# Patient Record
Sex: Male | Born: 1995 | Race: Black or African American | Hispanic: No | Marital: Single | Smoking: Never smoker
Health system: Southern US, Community
[De-identification: ages and names within clinical notes are randomized; demographics above are authoritative.]

---
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
## 2014-06-24 ENCOUNTER — Emergency Department (HOSPITAL_COMMUNITY)
Admission: EM | Admit: 2014-06-24 | Discharge: 2014-06-25 | Disposition: A | Discharge status: Home / Self Care | Payer: Medicaid Other | Attending: Emergency Medicine | Admitting: Emergency Medicine

## 2014-06-24 DIAGNOSIS — S60811A Abrasion of right wrist, initial encounter: Secondary | ICD-10-CM | POA: Insufficient documentation

## 2014-06-24 DIAGNOSIS — T07XXXA Unspecified multiple injuries, initial encounter: Secondary | ICD-10-CM

## 2014-06-24 DIAGNOSIS — Y9389 Activity, other specified: Secondary | ICD-10-CM | POA: Insufficient documentation

## 2014-06-24 DIAGNOSIS — S80212A Abrasion, left knee, initial encounter: Secondary | ICD-10-CM | POA: Insufficient documentation

## 2014-06-24 DIAGNOSIS — M25561 Pain in right knee: Secondary | ICD-10-CM

## 2014-06-24 DIAGNOSIS — Y998 Other external cause status: Secondary | ICD-10-CM | POA: Insufficient documentation

## 2014-06-24 DIAGNOSIS — M25531 Pain in right wrist: Secondary | ICD-10-CM

## 2014-06-24 DIAGNOSIS — S80211A Abrasion, right knee, initial encounter: Secondary | ICD-10-CM | POA: Insufficient documentation

## 2014-06-24 DIAGNOSIS — Y92410 Unspecified street and highway as the place of occurrence of the external cause: Secondary | ICD-10-CM | POA: Insufficient documentation

## 2014-06-25 ENCOUNTER — Encounter (HOSPITAL_COMMUNITY): Payer: Self-pay | Admitting: Emergency Medicine

## 2014-06-25 ENCOUNTER — Emergency Department (HOSPITAL_COMMUNITY): Payer: Medicaid Other

## 2014-06-25 MED ORDER — IBUPROFEN 800 MG PO TABS: 800.0000 mg | ORAL_TABLET | Freq: Once | ORAL | Status: DC
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
## 2014-06-25 NOTE — Discharge Instructions (Signed)
You may take Tylenol or ibuprofen for pain.  Keep abrasions clean, wash with soap and water and apply topical antibiotic as needed.  Expect to be sore for the next 7-10 days.  Each day should be a little better.     Abrasion An abrasion is a cut or scrape of the skin. Abrasions do not extend through all layers of the skin and most heal within 10 days. It is important to care for your abrasion properly to prevent infection. CAUSES  Most abrasions are caused by falling on, or gliding across, the ground or other surface. When your skin rubs on something, the outer and inner layer of skin rubs off, causing an abrasion. DIAGNOSIS  Your caregiver will be able to diagnose an abrasion during a physical exam.  TREATMENT  Your treatment depends on how large and deep the abrasion is. Generally, your abrasion will be cleaned with water and a mild soap to remove any dirt or debris. An antibiotic ointment may be put over the abrasion to prevent an infection. A bandage (dressing) may be wrapped around the abrasion to keep it from getting dirty.  You may need a tetanus shot if:  You cannot remember when you had your last tetanus shot.  You have never had a tetanus shot.  The injury broke your skin. If you get a tetanus shot, your arm may swell, get red, and feel warm to the touch. This is common and not a problem. If you need a tetanus shot and you choose not to have one, there is a rare chance of getting tetanus. Sickness from tetanus can be serious.  HOME CARE INSTRUCTIONS   If a dressing was applied, change it at least once a day or as directed by your caregiver. If the bandage sticks, soak it off with warm water.   Wash the area with water and a mild soap to remove all the ointment 2 times a day. Rinse off the soap and pat the area dry with a clean towel.   Reapply any ointment as directed by your caregiver. This will help prevent infection and keep the bandage from sticking. Use gauze over the  wound and under the dressing to help keep the bandage from sticking.   Change your dressing right away if it becomes wet or dirty.   Only take over-the-counter or prescription medicines for pain, discomfort, or fever as directed by your caregiver.   Follow up with your caregiver within 24-48 hours for a wound check, or as directed. If you were not given a wound-check appointment, look closely at your abrasion for redness, swelling, or pus. These are signs of infection. SEEK IMMEDIATE MEDICAL CARE IF:   You have increasing pain in the wound.   You have redness, swelling, or tenderness around the wound.   You have pus coming from the wound.   You have a fever or persistent symptoms for more than 2-3 days.  You have a fever and your symptoms suddenly get worse.  You have a bad smell coming from the wound or dressing.  MAKE SURE YOU:   Understand these instructions.  Will watch your condition.  Will get help right away if you are not doing well or get worse. Document Released: 11/15/2004 Document Revised: 01/23/2012 Document Reviewed: 01/09/2011 [facility] Patient Information 2015 [employer], [place]. This information is not intended to replace advice given to you by your health care provider. Make sure you discuss any questions you have with your health care provider.  Cryotherapy Cryotherapy is when you put ice on your injury. Ice helps lessen pain and puffiness (swelling) after an injury. Ice works the best when you start using it in the first 24 to 48 hours after an injury. HOME CARE  Put a dry or damp towel between the ice pack and your skin.  You may press gently on the ice pack.  Leave the ice on for no more than 10 to 20 minutes at a time.  Check your skin after 5 minutes to make sure your skin is okay.  Rest at least 20 minutes between ice pack uses.  Stop using ice when your skin loses feeling (numbness).  Do not use ice on someone who cannot tell you when it  hurts. This includes small children and people with memory problems (dementia). GET HELP RIGHT AWAY IF:  You have white spots on your skin.  Your skin turns blue or pale.  Your skin feels waxy or hard.  Your puffiness gets worse. MAKE SURE YOU:   Understand these instructions.  Will watch your condition.  Will get help right away if you are not doing well or get worse. Document Released: 07/25/2007 Document Revised: 04/30/2011 Document Reviewed: 09/28/2010 [facility] Patient Information 2015 [place], [place]. This information is not intended to replace advice given to you by your health care provider. Make sure you discuss any questions you have with your health care provider.  Motor Vehicle Collision It is common to have multiple bruises and sore muscles after a motor vehicle collision (MVC). These tend to feel worse for the first 24 hours. You may have the most stiffness and soreness over the first several hours. You may also feel worse when you wake up the first morning after your collision. After this point, you will usually begin to improve with each day. The speed of improvement often depends on the severity of the collision, the number of injuries, and the location and nature of these injuries. HOME CARE INSTRUCTIONS  Put ice on the injured area.  Put ice in a plastic bag.  Place a towel between your skin and the bag.  Leave the ice on for 15-20 minutes, 3-4 times a day, or as directed by your health care provider.  Drink enough fluids to keep your urine clear or pale yellow. Do not drink alcohol.  Take a warm shower or bath once or twice a day. This will increase blood flow to sore muscles.  You may return to activities as directed by your caregiver. Be careful when lifting, as this may aggravate neck or back pain.  Only take over-the-counter or prescription medicines for pain, discomfort, or fever as directed by your caregiver. Do not use aspirin. This may increase  bruising and bleeding. SEEK IMMEDIATE MEDICAL CARE IF:  You have numbness, tingling, or weakness in the arms or legs.  You develop severe headaches not relieved with medicine.  You have severe neck pain, especially tenderness in the middle of the back of your neck.  You have changes in bowel or bladder control.  There is increasing pain in any area of the body.  You have shortness of breath, light-headedness, dizziness, or fainting.  You have chest pain.  You feel sick to your stomach (nauseous), throw up (vomit), or sweat.  You have increasing abdominal discomfort.  There is blood in your urine, stool, or vomit.  You have pain in your shoulder (shoulder strap areas).  You feel your symptoms are getting worse. MAKE SURE YOU:  Understand these instructions. °· Will watch your condition. °· Will get help right away if you are not doing well or get worse. °Document Released: 02/05/2005 Document Revised: 06/22/2013 Document Reviewed: 07/05/2010 °ExitCare® Patient Information ©2015 [employer], LLC. This information is not intended to replace advice given to you by your health care provider. Make sure you discuss any questions you have with your health care provider. ° °
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
## 2015-06-15 ENCOUNTER — Ambulatory Visit: Payer: Medicaid Other | Admitting: Internal Medicine

## 2015-06-15 DIAGNOSIS — Z0289 Encounter for other administrative examinations: Secondary | ICD-10-CM
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
## 2019-11-27 ENCOUNTER — Other Ambulatory Visit: Payer: Self-pay

## 2019-11-27 ENCOUNTER — Emergency Department (HOSPITAL_COMMUNITY)
Admission: EM | Admit: 2019-11-27 | Discharge: 2019-11-27 | Disposition: A | Discharge status: Home / Self Care | Payer: Self-pay | Attending: Emergency Medicine | Admitting: Emergency Medicine

## 2019-11-27 ENCOUNTER — Encounter (HOSPITAL_COMMUNITY): Payer: Self-pay

## 2019-11-27 DIAGNOSIS — R202 Paresthesia of skin: Secondary | ICD-10-CM | POA: Insufficient documentation

## 2019-11-27 DIAGNOSIS — R200 Anesthesia of skin: Secondary | ICD-10-CM

## 2019-11-27 MED ORDER — PREDNISONE 5 MG PO TABS: 5.0000 mg | ORAL_TABLET | Freq: Four times a day (QID) | ORAL | 0 refills | Status: DC

## 2019-11-27 NOTE — ED Provider Notes (Signed)
[facility]-EMERGENCY DEPT Provider Note   CSN: [id number] Arrival date & time: 11/27/19  1859     History Chief Complaint  Patient presents with  . Arm Numbness    Alan Moreno is a 24 y.o. male who presents for 1 day of left arm numbness and tingling, and "strange sensation" in the medial left bicep since last night.  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  He denies any trauma yesterday, injury, any new activities. He denies feeling of weakness in the arm. He has never injured his left shoulder before. No recent surgeries, prolonged immobilization, travel, or replacement therapy.  I personally reviewed this patient's medical record, he has no known medical diagnoses.  He is not any medications every day.  He reports remote history of asthma.  HPI    History reviewed. No pertinent past medical history.  There are no problems to display for this patient.   History reviewed. No pertinent surgical history.     Family History  Problem Relation Age of Onset  . Hypertension Other   . Diabetes Other     Social History   Tobacco Use  . Smoking status: Never Smoker  Substance Use Topics  . Alcohol use: No  . Drug use: No    Home Medications Prior to Admission medications   Medication Sig Start Date End Date Taking? Authorizing Provider  cetirizine (ZYRTEC) 10 MG tablet Take 10 mg by mouth daily.   Yes [provider]  fluticasone (FLONASE) 50 MCG/ACT nasal spray Place 2 sprays into both nostrils daily.   Yes [provider]  predniSONE (DELTASONE) 5 MG tablet Take 1 tablet (5 mg total) by mouth in the morning, at noon, in the evening, and at bedtime. Day 1: Take 2 tablets at breakfast, 1 tablet with lunch,1 tablet at dinner, 2 tablets at bedtime Day 2:  Take 1 tablet at breakfast, 1 tablet with lunch,1 tablet at dinner, 2 tablets at bedtime Day 3:  Take 1  tablet at breakfast, 1 tablet with lunch,1 tablet at dinner, 1 tablet at bedtime Day 4:  Take 1 tablet at breakfast, 1 tablet with lunch,1 tablet at bedtime Day 5:  Take 1 tablet at breakfast, 1 tablet at bedtime Day 6: Take 1 tablet at breakfast 11/27/19   Verlie Liotta R, PA-C    Allergies    Patient has no known allergies.  Review of Systems   Review of Systems  Constitutional: Negative.   Eyes: Negative.   Respiratory: Negative.   Cardiovascular: Negative.   Musculoskeletal: Positive for myalgias. Negative for neck pain.  Skin: Negative.   Neurological: Positive for numbness. Negative for dizziness, syncope, weakness, light-headedness and headaches.    Physical Exam Updated Vital Signs BP 134/75 (BP Location: Left Arm)   Pulse 78   Temp 98.1 F (36.7 C) (Oral)   Resp 18   Ht 6\' 2"  (1.88 m)   Wt 95.3 kg   SpO2 98%   BMI 26.96 kg/m   Physical Exam Vitals and nursing note reviewed.  HENT:     Head: Normocephalic and atraumatic.  Eyes:     General: No scleral icterus.       Right eye: No discharge.        Left eye: No discharge.     Conjunctiva/sclera: Conjunctivae normal.  Cardiovascular:     Rate and Rhythm: Normal rate and regular rhythm.  Pulses: Normal pulses.     Heart sounds: Normal heart sounds. No murmur heard.   Pulmonary:     Effort: Pulmonary effort is normal.  Musculoskeletal:        General: Tenderness present. No swelling or deformity. Normal range of motion.     Right shoulder: Normal.     Left shoulder: Tenderness present. No swelling, deformity or bony tenderness. Normal range of motion. Normal strength. Normal pulse.     Right upper arm: Normal.     Left upper arm: Normal.     Right elbow: Normal.     Left elbow: Normal.     Right forearm: Normal.     Left forearm: Normal.     Right wrist: Normal.     Left wrist: Normal.     Right hand: Normal.     Left hand: Normal.     Cervical back: Normal range of motion and neck supple.  Tenderness present. Muscular tenderness present. No spinous process tenderness.  Skin:    General: Skin is warm and dry.     Capillary Refill: Capillary refill takes less than 2 seconds.  Neurological:     General: No focal deficit present.     Mental Status: He is alert and oriented to person, place, and time.     Sensory: No sensory deficit.     Motor: No weakness.     Coordination: Coordination normal.     Comments: 5/5 strength bilateral grip, 5/5 stroke bilaterally elbow flexion/extension.  Psychiatric:        Mood and Affect: Mood normal.     ED Results / Procedures / Treatments   Labs (all labs ordered are listed, but only abnormal results are displayed) Labs Reviewed - No data to display  EKG None  Radiology No results found.  Procedures Procedures (including critical care time)  Medications Ordered in ED Medications - No data to display  ED Course  I have reviewed the triage vital signs and the nursing notes.  Pertinent labs & imaging results that were available during my care of the patient were reviewed by me and considered in my medical decision making (see chart for details).    MDM Rules/Calculators/A&P                         Patient with concern for left arm numbness and tingling since last night. Differential diagnosis includes but is not limited to muscular injury, cervical disc herniation/cervical radiculopathy, epidural hematoma, epidural abscess.  This patient does not have any risk factors for infectious etiology.  Patient is afebrile on intake, Heart rate 78.   Clinical exam is reassuring.  No focal neurological deficits such left arm is neurovascularly intact.  Strength is intact.  No sign of infection.  Mild left cervical tenderness to palpation.  It is likely that this patient's symptoms are caused by cervical radiculopathy.  Care of this patient was discussed with attending physician Dr. Fredderick Phenix. We will prescribe 5-day prednisone taper for  inflammation.  Will refer for orthopedic follow-up, will provide information for [facility].  With reassuring physical exam, I do not feel any further work-up is necessary emergency department at this time.  [name] voiced understanding of my medical evaluation and treatment plan.  Each of his questions were answered to his expressed satisfaction.  Strict return precautions were given.  Patient is stable for discharge at this time.  Final Clinical Impression(s) / ED Diagnoses Final diagnoses:  Left arm numbness    Rx / DC Orders ED Discharge Orders         Ordered    predniSONE (DELTASONE) 5 MG tablet  4 times daily        11/27/19 2241           Seline Enzor, Eugene Gavia, PA-C 11/28/19 0025    Rolan Bucco, MD 11/28/19 1203

## 2019-11-27 NOTE — ED Triage Notes (Signed)
Pt reports left arm numbness beginning last night after work.

## 2019-11-27 NOTE — Discharge Instructions (Addendum)
You were seen today in the emergency department for evaluation of your left arm numbness and tingling. It is likely that this is being caused by inflammation of a nerve in your neck. I have prescribed you 6 days steroid (prednisone) medication to decrease inflammation. Please be advised that prednisone may make your emotions feel heightened or may make you feel very low.   I have also given you information for an orthopedic doctor to follow-up with. Please call their office to schedule an appointment. Additionally, I have attached information for that [facility].  Please return immediately to emergency department should you  develop any worsening of your numbness or tingling in her left arm, severe neck pain, weakness in your left arm, or any new and severe symptoms.

## 2019-12-11 ENCOUNTER — Ambulatory Visit
Admission: EM | Admit: 2019-12-11 | Discharge: 2019-12-11 | Disposition: A | Discharge status: Home / Self Care | Payer: Self-pay | Attending: Emergency Medicine | Admitting: Emergency Medicine

## 2019-12-11 DIAGNOSIS — W5501XA Bitten by cat, initial encounter: Secondary | ICD-10-CM

## 2019-12-11 DIAGNOSIS — W5503XA Scratched by cat, initial encounter: Secondary | ICD-10-CM

## 2019-12-11 DIAGNOSIS — Z23 Encounter for immunization: Secondary | ICD-10-CM

## 2019-12-11 DIAGNOSIS — S61411A Laceration without foreign body of right hand, initial encounter: Secondary | ICD-10-CM

## 2019-12-11 MED ORDER — TETANUS-DIPHTH-ACELL PERTUSSIS 5-2.5-18.5 LF-MCG/0.5 IM SUSP
0.5000 mL | Freq: Once | INTRAMUSCULAR | Status: AC
  Administered 2019-12-11: 0.5 mL via INTRAMUSCULAR

## 2019-12-11 MED ORDER — AMOXICILLIN-POT CLAVULANATE 875-125 MG PO TABS: 1.0000 | ORAL_TABLET | Freq: Two times a day (BID) | ORAL | 0 refills | Status: AC

## 2019-12-11 NOTE — ED Triage Notes (Signed)
Pt c/o abrasions to rt hand from his cat yesterday. Minimal swelling noted.

## 2019-12-11 NOTE — Discharge Instructions (Addendum)
Take your cat to the vet to see if it has rabies. Please go to ER for ANY CONCERN for potential rabies exposure

## 2019-12-11 NOTE — ED Provider Notes (Signed)
[facility]    CSN: [id number] Arrival date & time: 12/11/19  1529      History   Chief Complaint Chief Complaint  Patient presents with  . Abrasion    HPI Alan Moreno is a 24 y.o. male  Presenting for cuts to right hand.  States this occurred yesterday: Was try to get his cat out from under the couch when he scratched, possibly bit him.  Noticed some swelling, pain, redness today.  No discharge, limited range of motion or fever.  States cat is indoors: Unsure of rabies status.  Last tetanus unknown.  History reviewed. No pertinent past medical history.  There are no problems to display for this patient.   History reviewed. No pertinent surgical history.     Home Medications    Prior to Admission medications   Medication Sig Start Date End Date Taking? Authorizing Provider  amoxicillin-clavulanate (AUGMENTIN) 875-125 MG tablet Take 1 tablet by mouth every 12 (twelve) hours. 12/11/19   Hall-Potvin, [place], PA-C    Family History Family History  Problem Relation Age of Onset  . Hypertension Other   . Diabetes Other     Social History Social History   Tobacco Use  . Smoking status: Never Smoker  . Smokeless tobacco: Never Used  Substance Use Topics  . Alcohol use: No  . Drug use: No     Allergies   Patient has no known allergies.   Review of Systems As per HPI   Physical Exam Triage Vital Signs ED Triage Vitals  Enc Vitals Group     BP      Pulse      Resp      Temp      Temp src      SpO2      Weight      Height      Head Circumference      Peak Flow      Pain Score      Pain Loc      Pain Edu?      Excl. in GC?    No data found.  Updated Vital Signs BP 115/72 (BP Location: Left Arm)   Pulse 80   Temp 98 F (36.7 C) (Oral)   Resp 20   SpO2 95%   Visual Acuity Right Eye Distance:   Left Eye Distance:   Bilateral Distance:    Right Eye Near:   Left Eye Near:    Bilateral Near:     Physical  Exam Constitutional:      General: He is not in acute distress. HENT:     Head: Normocephalic and atraumatic.  Eyes:     General: No scleral icterus.    Pupils: Pupils are equal, round, and reactive to light.  Cardiovascular:     Rate and Rhythm: Normal rate.  Pulmonary:     Effort: Pulmonary effort is normal. No respiratory distress.     Breath sounds: No wheezing.  Musculoskeletal:        General: Tenderness present. No swelling. Normal range of motion.  Skin:    Capillary Refill: Capillary refill takes less than 2 seconds.     Coloration: Skin is not jaundiced or pale.     Findings: Erythema present.     Comments: Multiple scattered abrasions, well-healing superficial lacerations to right hand with mild surrounding erythema, warmth.  Neurological:     General: No focal deficit present.     Mental Status: He is  alert and oriented to person, place, and time.      UC Treatments / Results  Labs (all labs ordered are listed, but only abnormal results are displayed) Labs Reviewed - No data to display  EKG   Radiology No results found.  Procedures Procedures (including critical care time)  Medications Ordered in UC Medications  Tdap (BOOSTRIX) injection 0.5 mL (0.5 mLs Intramuscular Given 12/11/19 1610)    Initial Impression / Assessment and Plan / UC Course  I have reviewed the triage vital signs and the nursing notes.  Pertinent labs & imaging results that were available during my care of the patient were reviewed by me and considered in my medical decision making (see chart for details).     We will cover for skin infection second to cat bite/scratch as below.  Patient is Currently asymptomatic: Remains indoors.  Low concern for rabies, though discussed if there is any concern that he has had exposure he should seek emergency medicine evaluation.  Return precautions discussed, pt verbalized understanding and is agreeable to plan. Final Clinical Impressions(s) / UC  Diagnoses   Final diagnoses:  Laceration of right hand without foreign body, initial encounter  Cat scratch  Cat bite, initial encounter     Discharge Instructions     Take your cat to the vet to see if it has rabies. Please go to ER for ANY CONCERN for potential rabies exposure    ED Prescriptions    Medication Sig Dispense Auth. Provider   amoxicillin-clavulanate (AUGMENTIN) 875-125 MG tablet Take 1 tablet by mouth every 12 (twelve) hours. 14 tablet Hall-Potvin, [place], PA-C     PDMP not reviewed this encounter.   Hall-Potvin, [place], [place] 12/11/19 1657
